# Patient Record
Sex: Male | Born: 1955 | Race: Black or African American | Hispanic: No | Marital: Single | Smoking: Former smoker
Health system: Southern US, Community
[De-identification: ages and names within clinical notes are randomized; demographics above are authoritative.]

## PROBLEM LIST (undated history)

## (undated) DIAGNOSIS — I10 Essential (primary) hypertension: Secondary | ICD-10-CM

## (undated) DIAGNOSIS — I2510 Atherosclerotic heart disease of native coronary artery without angina pectoris: Secondary | ICD-10-CM

## (undated) DIAGNOSIS — M109 Gout, unspecified: Secondary | ICD-10-CM

## (undated) DIAGNOSIS — G629 Polyneuropathy, unspecified: Secondary | ICD-10-CM

## (undated) DIAGNOSIS — J45909 Unspecified asthma, uncomplicated: Secondary | ICD-10-CM

## (undated) DIAGNOSIS — I509 Heart failure, unspecified: Secondary | ICD-10-CM

## (undated) DIAGNOSIS — E119 Type 2 diabetes mellitus without complications: Secondary | ICD-10-CM

## (undated) HISTORY — PX: TONSILLECTOMY: SUR1361

## (undated) HISTORY — PX: PERCUTANEOUS CORONARY STENT INTERVENTION (PCI-S): SHX6016

## (undated) HISTORY — PX: APPENDECTOMY: SHX54

## (undated) HISTORY — PX: CARDIAC SURGERY: SHX584

---

## 2014-08-19 ENCOUNTER — Encounter (HOSPITAL_COMMUNITY): Payer: Self-pay | Admitting: Emergency Medicine

## 2014-08-19 ENCOUNTER — Observation Stay (HOSPITAL_COMMUNITY)
Admission: EM | Admit: 2014-08-19 | Discharge: 2014-08-19 | Disposition: A | Discharge status: Home / Self Care | Payer: Self-pay | Attending: Emergency Medicine | Admitting: Emergency Medicine

## 2014-08-19 ENCOUNTER — Emergency Department (HOSPITAL_COMMUNITY): Payer: Self-pay

## 2014-08-19 DIAGNOSIS — Z87891 Personal history of nicotine dependence: Secondary | ICD-10-CM | POA: Insufficient documentation

## 2014-08-19 DIAGNOSIS — Z880 Allergy status to penicillin: Secondary | ICD-10-CM | POA: Insufficient documentation

## 2014-08-19 DIAGNOSIS — J181 Lobar pneumonia, unspecified organism: Secondary | ICD-10-CM

## 2014-08-19 DIAGNOSIS — J189 Pneumonia, unspecified organism: Secondary | ICD-10-CM | POA: Insufficient documentation

## 2014-08-19 DIAGNOSIS — I2510 Atherosclerotic heart disease of native coronary artery without angina pectoris: Secondary | ICD-10-CM | POA: Insufficient documentation

## 2014-08-19 DIAGNOSIS — I10 Essential (primary) hypertension: Secondary | ICD-10-CM | POA: Insufficient documentation

## 2014-08-19 DIAGNOSIS — I509 Heart failure, unspecified: Secondary | ICD-10-CM | POA: Insufficient documentation

## 2014-08-19 DIAGNOSIS — R0789 Other chest pain: Secondary | ICD-10-CM

## 2014-08-19 DIAGNOSIS — E119 Type 2 diabetes mellitus without complications: Secondary | ICD-10-CM | POA: Insufficient documentation

## 2014-08-19 DIAGNOSIS — R079 Chest pain, unspecified: Principal | ICD-10-CM | POA: Insufficient documentation

## 2014-08-19 HISTORY — DX: Type 2 diabetes mellitus without complications: E11.9

## 2014-08-19 HISTORY — DX: Essential (primary) hypertension: I10

## 2014-08-19 HISTORY — DX: Atherosclerotic heart disease of native coronary artery without angina pectoris: I25.10

## 2014-08-19 HISTORY — DX: Unspecified asthma, uncomplicated: J45.909

## 2014-08-19 HISTORY — DX: Heart failure, unspecified: I50.9

## 2014-08-19 LAB — CBC
HCT: 36.8 % — ABNORMAL LOW (ref 39.0–52.0)
Hemoglobin: 11.7 g/dL — ABNORMAL LOW (ref 13.0–17.0)
MCH: 29.8 pg (ref 26.0–34.0)
MCHC: 31.8 g/dL (ref 30.0–36.0)
MCV: 93.9 fL (ref 78.0–100.0)
PLATELETS: 195 10*3/uL (ref 150–400)
RBC: 3.92 MIL/uL — ABNORMAL LOW (ref 4.22–5.81)
RDW: 15.2 % (ref 11.5–15.5)
WBC: 5.6 10*3/uL (ref 4.0–10.5)

## 2014-08-19 LAB — BASIC METABOLIC PANEL
Anion gap: 8 (ref 5–15)
BUN: 13 mg/dL (ref 6–20)
CALCIUM: 9 mg/dL (ref 8.9–10.3)
CHLORIDE: 104 mmol/L (ref 101–111)
CO2: 28 mmol/L (ref 22–32)
Creatinine, Ser: 0.91 mg/dL (ref 0.61–1.24)
GFR calc Af Amer: >60 mL/min (ref >60)
Glucose, Bld: 93 mg/dL (ref 65–99)
Potassium: 4.1 mmol/L (ref 3.5–5.1)
SODIUM: 140 mmol/L (ref 135–145)

## 2014-08-19 LAB — I-STAT TROPONIN, ED: Troponin i, poc: 0 ng/mL (ref 0.00–0.08)

## 2014-08-19 MED ORDER — CLONIDINE HCL 0.2 MG PO TABS
0.3000 mg | ORAL_TABLET | Freq: Three times a day (TID) | ORAL | Status: DC
  Administered 2014-08-19: 0.3 mg via ORAL
  Filled 2014-08-19 (×2): qty 1

## 2014-08-19 MED ORDER — CLOPIDOGREL BISULFATE 75 MG PO TABS: 75.0000 mg | ORAL_TABLET | Freq: Every day | ORAL | Status: DC

## 2014-08-19 MED ORDER — ASPIRIN 325 MG PO TABS: 325.0000 mg | ORAL_TABLET | Freq: Every day | ORAL | Status: DC

## 2014-08-19 MED ORDER — AZITHROMYCIN 250 MG PO TABS: ORAL_TABLET | ORAL | Status: DC

## 2014-08-19 MED ORDER — DEXTROSE 5 % IV SOLN
2.0000 g | Freq: Once | INTRAVENOUS | Status: AC
  Administered 2014-08-19: 2 g via INTRAVENOUS
  Filled 2014-08-19: qty 2

## 2014-08-19 MED ORDER — MORPHINE SULFATE 4 MG/ML IJ SOLN
4.0000 mg | Freq: Once | INTRAMUSCULAR | Status: AC
  Administered 2014-08-19: 4 mg via INTRAVENOUS
  Filled 2014-08-19: qty 1

## 2014-08-19 MED ORDER — AZITHROMYCIN 250 MG PO TABS
500.0000 mg | ORAL_TABLET | Freq: Once | ORAL | Status: AC
  Administered 2014-08-19: 500 mg via ORAL
  Filled 2014-08-19: qty 2

## 2014-08-19 NOTE — ED Notes (Signed)
Dr Julian Reil, Hospitalist at bedside.

## 2014-08-19 NOTE — ED Notes (Signed)
Attempted report. States they did not know she was getting a patient.

## 2014-08-19 NOTE — Discharge Instructions (Signed)

## 2014-08-19 NOTE — ED Notes (Signed)
Patient in custody with Officer. Officer states patient remain in brown pants and handcuffed to the bed. Officer at bedside.

## 2014-08-19 NOTE — ED Notes (Signed)
Patient come [place] he started having Chest Pain  In the past hour. 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 Patient states the pain feel like pervious MI. Patient describes the pain of 7/10 and sharpness, and radiating. Patient states he has had Sweating N/V. Patient given  and total 4 Nitro and 6 MG morphine with EMS. Patient has 20 L Hand placed by EMS>

## 2014-08-19 NOTE — Consult Note (Signed)
[facility] Medical Consultation  Aran Menning MWU:[id number] DOB: April 29, 1955 DOA: 08/19/2014 PCP: No primary care provider on file.   Requesting physician: Dr. Lynelle Doctor Date of consultation: 08/19/14 Reason for consultation: Chest pain  Impression/Recommendations Principal Problem:   Chest pain    1. Chest pain - Extremely unlikely to be ACS / CAD, the patient just had a negative heart cath 9 days ago at [facility], these records including the procedure report, conclusion, and recommendation of medical management, has been faxed over and is reviewed in writing.  When asked about this, patient admits that this information is accurate. 1. Dont think patient needs admission for ACS evaluation / rule out at this time, this would be ultra-low yield in this patient given the history and ED findings.    Chief Complaint: Chest pain  HPI:  Mr. Darren Morales is a 59 yo M who presents to the ED with left sided chest pain and pressure, radiating to his left arm.  He is currently incarcerated.  He initially reports a history of 7 stents for CAD in the past, with last one a month ago.  CP onset today while being transported from another medical checkup.  He says symptoms were like prior ACS.  While he initially didn't report this to myself or the EDP, it turns out that Mr. Ebersole actually had a stress test a couple of weeks ago and a heart cath on 08/10/14 which demonstrated "no angiographically significant coronary artery disease".  When I specifically asked him about this (after the charge nurse at his prision spoke to me over the phone, informed me that he has frequent history of chest pain admits and faxed to me this documentation of the heart cath 9 days ago done at [facility] hospital); the patient admits that he had a heart cath in the past couple of weeks that "showed no blockages"  Review of Systems:  12 systems reviewed and otherwise negative  Past Medical History  Diagnosis Date  .  Hypertension   . Diabetes mellitus without complication   . Asthma   . Coronary artery disease   . CHF (congestive heart failure)    Past Surgical History  Procedure Laterality Date  . Cardiac surgery    . Percutaneous coronary stent intervention (pci-s)     Social History:  reports that he has quit smoking. He does not have any smokeless tobacco history on file. He reports that he does not drink alcohol or use illicit drugs.  Allergies  Allergen Reactions  . Ciprofloxacin   . Keflex [Cephalexin]   . Motrin [Ibuprofen]   . Nifedipine   . Penicillins   . Toradol [Ketorolac Tromethamine]   . Zofran [Ondansetron Hcl]    No family history on file.  Prior to Admission medications   Medication Sig Start Date End Date Taking? Authorizing Provider  azithromycin (ZITHROMAX) 250 MG tablet Take 1 tab po qd for 5 days 08/19/14   Linwood Dibbles, MD   Physical Exam: Blood pressure 120/98, pulse 86, temperature 97.5 F (36.4 C), temperature source Oral, resp. rate 9, height 6\' 3"  (1.905 m), weight 115.667 kg (255 lb), SpO2 97 %. Filed Vitals:   08/19/14 2122  BP: 120/98  Pulse:   Temp:   Resp:     General:  NAD, resting comfortably in bed Eyes: PEERLA EOMI ENT: mucous membranes moist Neck: supple w/o JVD Cardiovascular: RRR w/o MRG Respiratory: CTA B Abdomen: soft, nt, nd, bs+ Skin: no rash nor lesion Musculoskeletal: MAE, full ROM all  4 extremities Psychiatric: normal tone and affect Neurologic: AAOx3, grossly non-focal  Labs on Admission:  Basic Metabolic Panel:  Recent Labs Lab 08/19/14 1825  NA 140  K 4.1  CL 104  CO2 28  GLUCOSE 93  BUN 13  CREATININE 0.91  CALCIUM 9.0   Liver Function Tests: No results for input(s): AST, ALT, ALKPHOS, BILITOT, PROT, ALBUMIN in the last 168 hours. No results for input(s): LIPASE, AMYLASE in the last 168 hours. No results for input(s): AMMONIA in the last 168 hours. CBC:  Recent Labs Lab 08/19/14 1825  WBC 5.6  HGB 11.7*   HCT 36.8*  MCV 93.9  PLT 195   Cardiac Enzymes: No results for input(s): CKTOTAL, CKMB, CKMBINDEX, TROPONINI in the last 168 hours. BNP: Invalid input(s): POCBNP CBG: No results for input(s): GLUCAP in the last 168 hours.  Radiological Exams on Admission: Dg Chest 2 View  08/19/2014   CLINICAL DATA:  Chest pain for 1 day  EXAM: CHEST  2 VIEW  COMPARISON:  None.  FINDINGS: There is infiltrate in the right middle lobe. Lungs elsewhere clear. Heart size is within normal limits. Pulmonary vascularity is normal. No adenopathy. No bone lesions.  IMPRESSION: Consolidation right middle lobe. Followup PA and lateral chest radiographs recommended in 3-4 weeks following trial of antibiotic therapy to ensure resolution and exclude underlying malignancy.   Electronically Signed   By: Bretta Bang III M.D.   On: 08/19/2014 19:08    EKG: Independently reviewed.  Time spent: 80 min  GARDNER, JARED M. [facility] Pager [phone]  If 7PM-7AM, please contact night-coverage [email or web address] Password [email or web address] 08/19/2014, 9:50 PM

## 2014-08-19 NOTE — ED Provider Notes (Addendum)
CSN: [id number]     Arrival date & time 08/19/14  1812 History   First MD Initiated Contact with Patient 08/19/14 1901     Chief Complaint  Patient presents with  . Chest Pain    Patient is a 59 y.o. male presenting with chest pain. The history is provided by the patient.  Chest Pain Pain location:  Substernal area Pain quality: pressure   Pain radiates to:  L arm Pain severity:  Severe Onset quality:  Sudden Duration:  1 hour Timing:  Constant Progression:  Resolved Chronicity:  Recurrent Relieved by:  Nitroglycerin and aspirin Risk factors: coronary artery disease   Pt is currently incarcerated.  He was getting transported from another medical check up when he started having chest pain.  911 was called.  He was given asa, morphine , ntg and sx improved.  Sx were likely prior ACS.  He has a history of multiple stents in the past.  Denies any fever.    Past Medical History  Diagnosis Date  . Hypertension   . Diabetes mellitus without complication   . Asthma   . Coronary artery disease   . CHF (congestive heart failure)    Past Surgical History  Procedure Laterality Date  . Cardiac surgery     No family history on file. History  Substance Use Topics  . Smoking status: Former Games developer  . Smokeless tobacco: Not on file  . Alcohol Use: No    Review of Systems  Cardiovascular: Positive for chest pain.  All other systems reviewed and are negative.     Allergies  Ciprofloxacin; Keflex; Motrin; Nifedipine; Penicillins; Toradol; and Zofran  Home Medications   Prior to Admission medications   Not on File   BP 148/117 mmHg  Pulse 65  Temp(Src) 97.5 F (36.4 C) (Oral)  Resp 15  Ht 6\' 3"  (1.905 m)  Wt 255 lb (115.667 kg)  BMI 31.87 kg/m2  SpO2 95% Physical Exam  Constitutional: He appears well-developed and well-nourished. No distress.  HENT:  Head: Normocephalic and atraumatic.  Right Ear: External ear normal.  Left Ear: External ear normal.  Eyes:  Conjunctivae are normal. Right eye exhibits no discharge. Left eye exhibits no discharge. No scleral icterus.  Neck: Neck supple. No tracheal deviation present.  Cardiovascular: Normal rate, regular rhythm and intact distal pulses.   Pulmonary/Chest: Effort normal and breath sounds normal. No stridor. No respiratory distress. He has no wheezes. He has no rales.  Abdominal: Soft. Bowel sounds are normal. He exhibits no distension. There is no tenderness. There is no rebound and no guarding.  Musculoskeletal: He exhibits no edema or tenderness.  Neurological: He is alert. He has normal strength. No cranial nerve deficit (no facial droop, extraocular movements intact, no slurred speech) or sensory deficit. He exhibits normal muscle tone. He displays no seizure activity. Coordination normal.  Skin: Skin is warm and dry. No rash noted.  Psychiatric: He has a normal mood and affect.  Nursing note and vitals reviewed.   ED Course  Procedures (including critical care time) Labs Review Labs Reviewed  CBC - Abnormal; Notable for the following:    RBC 3.92 (*)    Hemoglobin 11.7 (*)    HCT 36.8 (*)    All other components within normal limits  BASIC METABOLIC PANEL  I-STAT TROPOININ, ED    Imaging Review Dg Chest 2 View  08/19/2014   CLINICAL DATA:  Chest pain for 1 day  EXAM: CHEST  2 VIEW  COMPARISON:  None.  FINDINGS: There is infiltrate in the right middle lobe. Lungs elsewhere clear. Heart size is within normal limits. Pulmonary vascularity is normal. No adenopathy. No bone lesions.  IMPRESSION: Consolidation right middle lobe. Followup PA and lateral chest radiographs recommended in 3-4 weeks following trial of antibiotic therapy to ensure resolution and exclude underlying malignancy.   Electronically Signed   By: Bretta Bang III M.D.   On: 08/19/2014 19:08     EKG Interpretation   Date/Time:  Wednesday August 19 2014 18:18:43 EDT Ventricular Rate:  74 PR Interval:  193 QRS  Duration: 95 QT Interval:  419 QTC Calculation: 465 R Axis:   16 Text Interpretation:  Sinus rhythm Anterior infarct, old No old tracing to  compare Confirmed by Tasman Zapata  MD-J, Regginald Pask ([id number]) on 08/19/2014 7:35:58 PM      MDM   Final diagnoses:  Chest pain, unspecified chest pain type  CAP (community acquired pneumonia)    Patient's chest x-ray is showing a possible pneumonia. The patient is also having symptoms concerning for recurrent acute coronary syndrome. He is pain-free now. I will consult the medical service regarding admission for anti-biotics serial cardiac evaluation.    Linwood Dibbles, MD 08/19/14 1939  Dr Julian Reil evaluated the patient and [facility].  Pt actually has no history of heart disease.  He had a heart cath two weeks and it was normal.  The patient does not have stents. Pt is stable for discharge.  Outpatient follow up  Linwood Dibbles, MD 08/19/14 2122

## 2015-01-12 ENCOUNTER — Inpatient Hospital Stay
Admission: EM | Admit: 2015-01-12 | Discharge: 2015-01-13 | DRG: 303 | Disposition: A | Discharge status: Home / Self Care | Attending: Internal Medicine | Admitting: Internal Medicine

## 2015-01-12 ENCOUNTER — Emergency Department

## 2015-01-12 ENCOUNTER — Encounter: Payer: Self-pay | Admitting: Emergency Medicine

## 2015-01-12 DIAGNOSIS — Z653 Problems related to other legal circumstances: Secondary | ICD-10-CM | POA: Diagnosis not present

## 2015-01-12 DIAGNOSIS — R079 Chest pain, unspecified: Secondary | ICD-10-CM

## 2015-01-12 DIAGNOSIS — I200 Unstable angina: Secondary | ICD-10-CM

## 2015-01-12 DIAGNOSIS — Z7982 Long term (current) use of aspirin: Secondary | ICD-10-CM | POA: Diagnosis not present

## 2015-01-12 DIAGNOSIS — E669 Obesity, unspecified: Secondary | ICD-10-CM | POA: Diagnosis present

## 2015-01-12 DIAGNOSIS — Z9889 Other specified postprocedural states: Secondary | ICD-10-CM | POA: Diagnosis not present

## 2015-01-12 DIAGNOSIS — I25110 Atherosclerotic heart disease of native coronary artery with unstable angina pectoris: Secondary | ICD-10-CM | POA: Diagnosis not present

## 2015-01-12 DIAGNOSIS — Z87891 Personal history of nicotine dependence: Secondary | ICD-10-CM | POA: Diagnosis not present

## 2015-01-12 DIAGNOSIS — I10 Essential (primary) hypertension: Secondary | ICD-10-CM | POA: Diagnosis present

## 2015-01-12 DIAGNOSIS — Z955 Presence of coronary angioplasty implant and graft: Secondary | ICD-10-CM

## 2015-01-12 DIAGNOSIS — J45909 Unspecified asthma, uncomplicated: Secondary | ICD-10-CM | POA: Diagnosis present

## 2015-01-12 DIAGNOSIS — E1140 Type 2 diabetes mellitus with diabetic neuropathy, unspecified: Secondary | ICD-10-CM | POA: Diagnosis present

## 2015-01-12 DIAGNOSIS — Z888 Allergy status to other drugs, medicaments and biological substances status: Secondary | ICD-10-CM | POA: Diagnosis not present

## 2015-01-12 DIAGNOSIS — Z880 Allergy status to penicillin: Secondary | ICD-10-CM | POA: Diagnosis not present

## 2015-01-12 DIAGNOSIS — I255 Ischemic cardiomyopathy: Secondary | ICD-10-CM | POA: Diagnosis present

## 2015-01-12 DIAGNOSIS — Z79899 Other long term (current) drug therapy: Secondary | ICD-10-CM

## 2015-01-12 DIAGNOSIS — I509 Heart failure, unspecified: Secondary | ICD-10-CM | POA: Diagnosis present

## 2015-01-12 DIAGNOSIS — M109 Gout, unspecified: Secondary | ICD-10-CM | POA: Diagnosis present

## 2015-01-12 DIAGNOSIS — Z8249 Family history of ischemic heart disease and other diseases of the circulatory system: Secondary | ICD-10-CM | POA: Diagnosis not present

## 2015-01-12 DIAGNOSIS — Z9049 Acquired absence of other specified parts of digestive tract: Secondary | ICD-10-CM | POA: Diagnosis not present

## 2015-01-12 DIAGNOSIS — I160 Hypertensive urgency: Secondary | ICD-10-CM

## 2015-01-12 HISTORY — DX: Gout, unspecified: M10.9

## 2015-01-12 HISTORY — DX: Polyneuropathy, unspecified: G62.9

## 2015-01-12 LAB — BASIC METABOLIC PANEL
Anion gap: 7 (ref 5–15)
BUN: 14 mg/dL (ref 6–20)
CALCIUM: 9 mg/dL (ref 8.9–10.3)
CO2: 30 mmol/L (ref 22–32)
CREATININE: 0.85 mg/dL (ref 0.61–1.24)
Chloride: 104 mmol/L (ref 101–111)
GFR calc Af Amer: >60 mL/min (ref >60)
GLUCOSE: 115 mg/dL — AB (ref 65–99)
POTASSIUM: 3.8 mmol/L (ref 3.5–5.1)
Sodium: 141 mmol/L (ref 135–145)

## 2015-01-12 LAB — TROPONIN I
Troponin I: <0.03 ng/mL (ref <0.031)
Troponin I: <0.03 ng/mL (ref <0.031)

## 2015-01-12 LAB — CBC
HEMATOCRIT: 38.8 % — AB (ref 40.0–52.0)
Hemoglobin: 12.2 g/dL — ABNORMAL LOW (ref 13.0–18.0)
MCH: 28.1 pg (ref 26.0–34.0)
MCHC: 31.4 g/dL — AB (ref 32.0–36.0)
MCV: 89.5 fL (ref 80.0–100.0)
Platelets: 252 10*3/uL (ref 150–440)
RBC: 4.33 MIL/uL — ABNORMAL LOW (ref 4.40–5.90)
RDW: 15.9 % — ABNORMAL HIGH (ref 11.5–14.5)
WBC: 5.6 10*3/uL (ref 3.8–10.6)

## 2015-01-12 LAB — GLUCOSE, CAPILLARY
GLUCOSE-CAPILLARY: 114 mg/dL — AB (ref 65–99)
Glucose-Capillary: 134 mg/dL — ABNORMAL HIGH (ref 65–99)

## 2015-01-12 MED ORDER — NITROGLYCERIN 2 % TD OINT
0.5000 [in_us] | TOPICAL_OINTMENT | Freq: Four times a day (QID) | TRANSDERMAL | Status: DC
  Administered 2015-01-12 – 2015-01-13 (×4): 0.5 [in_us] via TOPICAL
  Filled 2015-01-12 (×4): qty 1

## 2015-01-12 MED ORDER — HYDRALAZINE HCL 50 MG PO TABS: 50.0000 mg | ORAL_TABLET | Freq: Three times a day (TID) | ORAL | Status: DC

## 2015-01-12 MED ORDER — CYPROHEPTADINE HCL 4 MG PO TABS
4.0000 mg | ORAL_TABLET | Freq: Three times a day (TID) | ORAL | Status: DC | PRN
  Filled 2015-01-12: qty 1

## 2015-01-12 MED ORDER — ALUM & MAG HYDROXIDE-SIMETH 200-200-20 MG/5ML PO SUSP: 30.0000 mL | Freq: Four times a day (QID) | ORAL | Status: DC | PRN

## 2015-01-12 MED ORDER — HYDRALAZINE HCL 20 MG/ML IJ SOLN
20.0000 mg | INTRAMUSCULAR | Status: DC | PRN
  Administered 2015-01-12: 20 mg via INTRAVENOUS
  Filled 2015-01-12: qty 1

## 2015-01-12 MED ORDER — ACETAMINOPHEN 650 MG RE SUPP: 650.0000 mg | Freq: Four times a day (QID) | RECTAL | Status: DC | PRN

## 2015-01-12 MED ORDER — NITROGLYCERIN IN D5W 200-5 MCG/ML-% IV SOLN
0.0000 ug/min | Freq: Once | INTRAVENOUS | Status: AC
  Administered 2015-01-12: 10 ug/min via INTRAVENOUS

## 2015-01-12 MED ORDER — OXYCODONE HCL 5 MG PO TABS
5.0000 mg | ORAL_TABLET | ORAL | Status: DC | PRN
Start: 2015-01-12 — End: 2015-01-13
  Administered 2015-01-12 – 2015-01-13 (×5): 5 mg via ORAL
  Filled 2015-01-12 (×5): qty 1

## 2015-01-12 MED ORDER — FLUOXETINE HCL 20 MG PO CAPS
20.0000 mg | ORAL_CAPSULE | Freq: Every day | ORAL | Status: DC
  Administered 2015-01-12: 20 mg via ORAL
  Filled 2015-01-12: qty 1

## 2015-01-12 MED ORDER — LOSARTAN POTASSIUM 50 MG PO TABS
100.0000 mg | ORAL_TABLET | Freq: Every day | ORAL | Status: DC
  Administered 2015-01-13: 100 mg via ORAL
  Filled 2015-01-12: qty 2

## 2015-01-12 MED ORDER — CLOPIDOGREL BISULFATE 75 MG PO TABS
75.0000 mg | ORAL_TABLET | Freq: Every day | ORAL | Status: DC
  Administered 2015-01-13: 75 mg via ORAL
  Filled 2015-01-12: qty 1

## 2015-01-12 MED ORDER — INSULIN ASPART 100 UNIT/ML ~~LOC~~ SOLN
0.0000 [IU] | Freq: Three times a day (TID) | SUBCUTANEOUS | Status: DC
  Filled 2015-01-12: qty 3

## 2015-01-12 MED ORDER — ACETAMINOPHEN 325 MG PO TABS: 650.0000 mg | ORAL_TABLET | Freq: Four times a day (QID) | ORAL | Status: DC | PRN

## 2015-01-12 MED ORDER — SODIUM CHLORIDE 0.9 % IJ SOLN: 3.0000 mL | Freq: Two times a day (BID) | INTRAMUSCULAR | Status: DC

## 2015-01-12 MED ORDER — HEPARIN (PORCINE) IN NACL 100-0.45 UNIT/ML-% IJ SOLN
1400.0000 [IU]/h | INTRAMUSCULAR | Status: DC
  Administered 2015-01-12 – 2015-01-13 (×2): 1400 [IU]/h via INTRAVENOUS
  Filled 2015-01-12 (×4): qty 250

## 2015-01-12 MED ORDER — LABETALOL HCL 5 MG/ML IV SOLN
20.0000 mg | Freq: Once | INTRAVENOUS | Status: AC
  Administered 2015-01-12: 20 mg via INTRAVENOUS
  Filled 2015-01-12: qty 4

## 2015-01-12 MED ORDER — MORPHINE SULFATE (PF) 2 MG/ML IV SOLN
1.0000 mg | INTRAVENOUS | Status: DC | PRN
Start: 2015-01-12 — End: 2015-01-13
  Administered 2015-01-12: 1 mg via INTRAVENOUS
  Filled 2015-01-12 (×2): qty 1

## 2015-01-12 MED ORDER — CARVEDILOL 25 MG PO TABS
25.0000 mg | ORAL_TABLET | Freq: Two times a day (BID) | ORAL | Status: DC
  Administered 2015-01-12: 25 mg via ORAL
  Filled 2015-01-12 (×2): qty 1

## 2015-01-12 MED ORDER — HEPARIN BOLUS VIA INFUSION
4000.0000 [IU] | Freq: Once | INTRAVENOUS | Status: AC
  Administered 2015-01-12: 4000 [IU] via INTRAVENOUS
  Filled 2015-01-12: qty 4000

## 2015-01-12 MED ORDER — HYDROCHLOROTHIAZIDE 25 MG PO TABS
25.0000 mg | ORAL_TABLET | Freq: Every day | ORAL | Status: DC
  Administered 2015-01-13: 25 mg via ORAL
  Filled 2015-01-12: qty 1

## 2015-01-12 MED ORDER — ATORVASTATIN CALCIUM 20 MG PO TABS
40.0000 mg | ORAL_TABLET | Freq: Every day | ORAL | Status: DC
  Administered 2015-01-12: 40 mg via ORAL
  Filled 2015-01-12: qty 2

## 2015-01-12 MED ORDER — HYDRALAZINE HCL 50 MG PO TABS
100.0000 mg | ORAL_TABLET | Freq: Three times a day (TID) | ORAL | Status: DC
  Administered 2015-01-12 – 2015-01-13 (×3): 100 mg via ORAL
  Filled 2015-01-12 (×3): qty 2

## 2015-01-12 MED ORDER — ISOSORBIDE MONONITRATE ER 60 MG PO TB24
60.0000 mg | ORAL_TABLET | Freq: Every day | ORAL | Status: DC
  Administered 2015-01-13: 60 mg via ORAL
  Filled 2015-01-12: qty 1

## 2015-01-12 MED ORDER — INSULIN ASPART 100 UNIT/ML ~~LOC~~ SOLN: 0.0000 [IU] | Freq: Every day | SUBCUTANEOUS | Status: DC

## 2015-01-12 MED ORDER — CLONIDINE HCL 0.1 MG PO TABS
0.2000 mg | ORAL_TABLET | Freq: Three times a day (TID) | ORAL | Status: DC
  Administered 2015-01-12 – 2015-01-13 (×4): 0.2 mg via ORAL
  Filled 2015-01-12 (×4): qty 2

## 2015-01-12 MED ORDER — MORPHINE SULFATE (PF) 4 MG/ML IV SOLN
INTRAVENOUS | Status: AC
  Administered 2015-01-12: 4 mg
  Filled 2015-01-12: qty 1

## 2015-01-12 MED ORDER — RANOLAZINE ER 500 MG PO TB12
500.0000 mg | ORAL_TABLET | Freq: Two times a day (BID) | ORAL | Status: DC
  Administered 2015-01-12 – 2015-01-13 (×2): 500 mg via ORAL
  Filled 2015-01-12 (×2): qty 1

## 2015-01-12 MED ORDER — NITROGLYCERIN IN D5W 200-5 MCG/ML-% IV SOLN
INTRAVENOUS | Status: AC
  Filled 2015-01-12: qty 250

## 2015-01-12 MED ORDER — PANTOPRAZOLE SODIUM 40 MG PO TBEC
40.0000 mg | DELAYED_RELEASE_TABLET | Freq: Every day | ORAL | Status: DC
  Administered 2015-01-13: 40 mg via ORAL
  Filled 2015-01-12: qty 1

## 2015-01-12 MED ORDER — ASPIRIN EC 81 MG PO TBEC
81.0000 mg | DELAYED_RELEASE_TABLET | Freq: Every day | ORAL | Status: DC
  Administered 2015-01-13: 81 mg via ORAL
  Filled 2015-01-12: qty 1

## 2015-01-12 MED ORDER — SENNOSIDES-DOCUSATE SODIUM 8.6-50 MG PO TABS: 1.0000 | ORAL_TABLET | Freq: Every evening | ORAL | Status: DC | PRN

## 2015-01-12 NOTE — Progress Notes (Signed)
Heparin 4000 unit bolus given followed by Heparin 1400 units/hr.  Verified by Kemper Durieonnisha, RN

## 2015-01-12 NOTE — Plan of Care (Signed)
Problem: Safety: Goal: Ability to remain free from injury will improve Outcome: Progressing Fall precautions in place  Problem: Pain Managment: Goal: General experience of comfort will improve Outcome: Progressing Prn meds  Problem: Tissue Perfusion: Goal: Risk factors for ineffective tissue perfusion will decrease Outcome: Progressing Heparin gtt  Problem: Consults Goal: Diabetes Guidelines if Diabetic/Glucose > 140 If diabetic or lab glucose is > 140 mg/dl - Initiate Diabetes/Hyperglycemia Guidelines & Document Interventions  Outcome: Progressing Protocol ordered

## 2015-01-12 NOTE — Progress Notes (Signed)
59 yo bm admitted to room 248 via stretcher from ED with chest pain.  A&O x3, pt states unable to ambulate due to being dizzy when up.  No distress on ra.  Cardiac monitor applied and verified by Edson SnowballShana, CNA. Lungs clear bil. Abdomen benign.  SL lt ac flushes well.  Oriented to room and surroundings.  Pt is a prisoner, lt leg shackled to bed and 2 guards present. POC reviewed with pt.  CB in reach, SR up x2.

## 2015-01-12 NOTE — ED Provider Notes (Signed)
[facility] Emergency Department Provider Note   ____________________________________________  Time seen: 11:10AM I have reviewed the triage vital signs and the triage nursing note.  HISTORY  Chief Complaint Chest Pain   Historian Patient  HPI Darren Morales is a [age] y.o. male with a history of MI in 2013, who is presenting with acute onset central chest pain that radiates into the left arm this started just prior to arrival and feels like the last time he was diagnosed with a heart attack. He does have a history of stents, hypertension, and CHF.  Pain is moderate to severe. Positive nausea. No sweating. Mild shortness of breath. No fevers or recent coughing illness.  No weakness or numbness.    Past Medical History  Diagnosis Date  . Hypertension   . Diabetes mellitus without complication (HCC)   . Asthma   . Coronary artery disease   . CHF (congestive heart failure) (HCC)   . Gout   . Neuropathy [facility](HCC)     Patient Active Problem List   Diagnosis Date Noted  . Chest pain 08/19/2014    Past Surgical History  Procedure Laterality Date  . Cardiac surgery    . Percutaneous coronary stent intervention (pci-s)    . Tonsillectomy    . Appendectomy      Current Outpatient Rx  Name  Route  Sig  Dispense  Refill  . aspirin EC 81 MG tablet   Oral   Take 81 mg by mouth daily.         Marland Kitchen. atorvastatin (LIPITOR) 40 MG tablet   Oral   Take 40 mg by mouth at bedtime.         . carvedilol (COREG) 25 MG tablet   Oral   Take 25 mg by mouth 2 (two) times daily with a meal.         . cloNIDine (CATAPRES) 0.2 MG tablet   Oral   Take 0.2 mg by mouth 3 (three) times daily.         . clopidogrel (PLAVIX) 75 MG tablet   Oral   Take 75 mg by mouth daily.         . cyproheptadine (PERIACTIN) 4 MG tablet   Oral   Take 4 mg by mouth 3 (three) times daily as needed for allergies.         Marland Kitchen. FLUoxetine (PROZAC) 20 MG capsule   Oral   Take  20 mg by mouth at bedtime.         . hydrALAZINE (APRESOLINE) 50 MG tablet   Oral   Take 50 mg by mouth 3 (three) times daily.         . hydrochlorothiazide (HYDRODIURIL) 25 MG tablet   Oral   Take 25 mg by mouth daily.         . isosorbide mononitrate (IMDUR) 60 MG 24 hr tablet   Oral   Take 60 mg by mouth daily.         Marland Kitchen. losartan (COZAAR) 50 MG tablet   Oral   Take 100 mg by mouth daily.         . pantoprazole (PROTONIX) 40 MG tablet   Oral   Take 40 mg by mouth daily.         . ranolazine (RANEXA) 500 MG 12 hr tablet   Oral   Take 500 mg by mouth 2 (two) times daily.           Allergies Ciprofloxacin; Keflex;  Motrin; Nifedipine; Penicillins; Toradol; and Zofran  History reviewed. No pertinent family history.  Social History Social History  Substance Use Topics  . Smoking status: Former Games developer  . Smokeless tobacco: None  . Alcohol Use: No    Review of Systems  Constitutional: Negative for fever. Eyes: Negative for visual changes. ENT: Negative for sore throat. Cardiovascular: Positive for chest pain. Respiratory: Positive for shortness of breath. Gastrointestinal: Negative for abdominal pain, vomiting and diarrhea. Genitourinary: Negative for dysuria. Musculoskeletal: Negative for back pain. Skin: Negative for rash. Neurological: Negative for headache. 10 point Review of Systems otherwise negative ____________________________________________   PHYSICAL EXAM:  VITAL SIGNS: ED Triage Vitals  Enc Vitals Group     BP --      Pulse Rate 01/12/15 1048 65     Resp 01/12/15 1048 18     Temp 01/12/15 1048 97.5 F (36.4 C)     Temp Source 01/12/15 1048 Oral     SpO2 01/12/15 1048 100 %     Weight 01/12/15 1048 260 lb (117.935 kg)     Height 01/12/15 1048  (1.905 m)     Head Cir --      Peak Flow --      Pain Score 01/12/15 1050 8     Pain Loc --      Pain Edu? --      Excl. in GC? --      Constitutional: Alert and oriented.  Well appearing and in no distress. Eyes: Conjunctivae are normal. PERRL. Normal extraocular movements. ENT   Head: Normocephalic and atraumatic.   Nose: No congestion/rhinnorhea.   Mouth/Throat: Mucous membranes are moist.   Neck: No stridor. Cardiovascular/Chest: Normal rate, regular rhythm.  No murmurs, rubs, or gallops. Respiratory: Normal respiratory effort without tachypnea nor retractions. Breath sounds are clear and equal bilaterally. No wheezes/rales/rhonchi. Gastrointestinal: Soft. No distention, no guarding, no rebound. Nontender.  Genitourinary/rectal:Deferred Musculoskeletal: Nontender with normal range of motion in all extremities. No joint effusions.  No lower extremity tenderness.  No edema. Neurologic:  Normal speech and language. No gross or focal neurologic deficits are appreciated. Skin:  Skin is warm, dry and intact. No rash noted. Psychiatric: Mood and affect are normal. Speech and behavior are normal. Patient exhibits appropriate insight and judgment.  ____________________________________________   EKG I, Governor Rooks, MD, the attending physician have personally viewed and interpreted all ECGs.  76 bpm. Normal sinus rhythm. Narrow QRS. Normal axis. Nonspecific T-wave ____________________________________________  LABS (pertinent positives/negatives)  Patient will Without significant abnormalities White blood count 5.6, hemoglobin 12.2 and platelet count 252 Troponin less than 0.03  ____________________________________________  RADIOLOGY All Xrays were viewed by me. Imaging interpreted by Radiologist.  Chest x-ray portable: no active disease __________________________________________  PROCEDURES  Procedure(s) performed: None  Critical Care performed: CRITICAL CARE Performed by: Governor Rooks   Total critical care time: 45 minutes  Critical care time was exclusive of separately billable procedures and treating other  patients.  Critical care was necessary to treat or prevent imminent or life-threatening deterioration.  Critical care was time spent personally by me on the following activities: development of treatment plan with patient and/or surrogate as well as nursing, discussions with consultants, evaluation of patient's response to treatment, examination of patient, obtaining history from patient or surrogate, ordering and performing treatments and interventions, ordering and review of laboratory studies, ordering and review of radiographic studies, pulse oximetry and re-evaluation of patient's condition.   ____________________________________________   ED COURSE / ASSESSMENT AND PLAN  CONSULTATIONS: Dr. Juliene Pina, hospitalist for admission  Pertinent labs & imaging results that were available during my care of the patient were reviewed by me and considered in my medical decision making (see chart for details).  This patient arrived complaining of central chest pain up into his left arm, concerning for possible cardiac etiology in a patient with a history of stents and prior MI that felt similar. Patient did have some improvement with sublingual nitros, 3, prehospital after aspirin. His blood pressure severely elevated on arrival, and so I started nitroglycerin drip.  No real significant improvement despite nitroglycerin drip titrated up. Patient was given morphine, did have some improvement in pain, blood pressure is still significantly elevated.  Out of concern for hypertensive urgency/emergency, patient was given a dose of labetalol 20 mg IV.  Blood pressure did respond to this and come down to a systolic blood pressure 120. He was stopped off of the nitroglycerin drip at this point time. Repeat blood pressure did show a systolic blood pressure 108. Patient is currently still complaining of some chest pressure, but much improved.  Discharge blood pressure was somewhat unexpected, and not the goal.  We'll continue to monitor.  Patient be admitted for chest pain rule out.   Patient / Family / Caregiver informed of clinical course, medical decision-making process, and agree with plan.    ___________________________________________   FINAL CLINICAL IMPRESSION(S) / ED DIAGNOSES   Final diagnoses:  Chest pain, unspecified chest pain type  Hypertensive urgency       Governor Rooks, MD 01/12/15 1424

## 2015-01-12 NOTE — ED Notes (Signed)
Pt reports waiting for the bus when he developed chest pain that he describes as stabbing and squeezing, with radiation to L arm.  Pt also reports having nausea, dizziness, diaphoresis.  Pt has history of coronary artery disease and stents.

## 2015-01-12 NOTE — Progress Notes (Addendum)
ANTICOAGULATION CONSULT NOTE - Initial Consult  Pharmacy Consult for Heparin  Indication: chest pain/ACS  Allergies  Allergen Reactions  . Ciprofloxacin   . Fish Allergy   . Keflex [Cephalexin]   . Motrin [Ibuprofen]   . Nifedipine   . Penicillins   . Pork-Derived Products   . Shrimp [Shellfish Allergy]   . Toradol [Ketorolac Tromethamine]   . Zofran [Ondansetron Hcl]     Patient Measurements: Height: 6\' 3"  (190.5 cm) Weight: 260 lb (117.935 kg) IBW/kg (Calculated) : 84.5 Heparin Dosing Weight: 109.3 kg   Vital Signs: Temp: 97.6 F (36.4 C) (12/20 1710) Temp Source: Oral (12/20 1710) BP: 155/114 mmHg (12/20 1710) Pulse Rate: 58 (12/20 1710)  Labs:  Recent Labs  01/12/15 1104  HGB 12.2*  HCT 38.8*  PLT 252  CREATININE 0.85  TROPONINI <0.03    Estimated Creatinine Clearance: 132.8 mL/min (by C-G formula based on Cr of 0.85).   Medical History: Past Medical History  Diagnosis Date  . Hypertension   . Diabetes mellitus without complication (HCC)   . Asthma   . Coronary artery disease   . CHF (congestive heart failure) (HCC)   . Gout   . Neuropathy (HCC)     Medications:  Prescriptions prior to admission  Medication Sig Dispense Refill Last Dose  . aspirin EC 81 MG tablet Take 81 mg by mouth daily.   01/12/2015 at Unknown time  . atorvastatin (LIPITOR) 40 MG tablet Take 40 mg by mouth at bedtime.   01/11/2015 at Unknown time  . carvedilol (COREG) 25 MG tablet Take 25 mg by mouth 2 (two) times daily with a meal.   01/12/2015 at Unknown time  . cloNIDine (CATAPRES) 0.2 MG tablet Take 0.2 mg by mouth 3 (three) times daily.   01/12/2015 at Unknown time  . clopidogrel (PLAVIX) 75 MG tablet Take 75 mg by mouth daily.   01/12/2015 at Unknown time  . cyproheptadine (PERIACTIN) 4 MG tablet Take 4 mg by mouth 3 (three) times daily as needed for allergies.   PRN  . FLUoxetine (PROZAC) 20 MG capsule Take 20 mg by mouth at bedtime.   01/11/2015 at Unknown time  .  hydrALAZINE (APRESOLINE) 50 MG tablet Take 50 mg by mouth 3 (three) times daily.   01/12/2015 at Unknown time  . hydrochlorothiazide (HYDRODIURIL) 25 MG tablet Take 25 mg by mouth daily.   01/12/2015 at Unknown time  . isosorbide mononitrate (IMDUR) 60 MG 24 hr tablet Take 60 mg by mouth daily.   01/12/2015 at Unknown time  . losartan (COZAAR) 50 MG tablet Take 100 mg by mouth daily.   01/12/2015 at Unknown time  . pantoprazole (PROTONIX) 40 MG tablet Take 40 mg by mouth daily.   01/12/2015 at Unknown time  . ranolazine (RANEXA) 500 MG 12 hr tablet Take 500 mg by mouth 2 (two) times daily.   01/12/2015 at Unknown time    Assessment: Pharmacy consulted to dose heparin in this 59 year old male admitted with ACS.  No prior anticoag noted.  Goal of Therapy:  Heparin level 0.3-0.7 units/ml Monitor platelets by anticoagulation protocol: Yes   Plan:  Give 4000 units bolus x 1 Start heparin infusion at 1400 units/hr  Will order 1st HL 6 hrs after start of drip on 12/21 @ 00:00. Will check CBC daily.   Shary Lamos D 01/12/2015,5:30 PM

## 2015-01-12 NOTE — H&P (Signed)
[facility] [place] [facility]   PATIENT NAME: Darren Morales    MR#:  [id number]  DATE OF BIRTH:  March 30, 1955  DATE OF ADMISSION:  01/12/2015  PRIMARY CARE PHYSICIAN: No PCP Per Patient   REQUESTING/REFERRING PHYSICIAN: Dr. Shaune Pollack  CHIEF COMPLAINT:   Chest pain HISTORY OF PRESENT ILLNESS:  Darren Morales  is a 59 y.o. male with a known history of coronary artery disease, essential hypertension and diabetes who presents from prison due to above complaint. Patient reports and sign 30s mornings at substernal chest pain rating to his left side. He denies shortness of breath or nausea associated. He says it's chest pain lasted a few hours. This is relieved with nitroglycerin that was given in the ER. It was also noted in the emergency room that his blood pressure was very elevated. He is chest pain-free currently. Patient denies any exacerbating factors. He states he had stents placed in June 2016.  PAST MEDICAL HISTORY:   Past Medical History  Diagnosis Date  . Hypertension   . Diabetes mellitus without complication (HCC)   . Asthma   . Coronary artery disease   . CHF (congestive heart failure) (HCC)   . Gout   . Neuropathy (HCC)     PAST SURGICAL HISTORY:   Past Surgical History  Procedure Laterality Date  . Cardiac surgery    . Percutaneous coronary stent intervention (pci-s)    . Tonsillectomy    . Appendectomy      SOCIAL HISTORY:   Social History  Substance Use Topics  . Smoking status: Former Games developer  . Smokeless tobacco: N  . Alcohol Use: No    FAMILY HISTORY:  Positive hypertension  DRUG ALLERGIES:   Allergies  Allergen Reactions  . Ciprofloxacin   . Keflex [Cephalexin]   . Motrin [Ibuprofen]   . Nifedipine   . Penicillins   . Toradol [Ketorolac Tromethamine]   . Zofran [Ondansetron Hcl]      REVIEW OF SYSTEMS:  CONSTITUTIONAL: No fever, fatigue or weakness.  EYES: No blurred or double vision.  EARS, NOSE, AND  THROAT: No tinnitus or ear pain.  RESPIRATORY: No cough, shortness of breath, wheezing or hemoptysis.  CARDIOVASCULAR: No Current chest pain, orthopnea, edema.  GASTROINTESTINAL: No nausea, vomiting, diarrhea or abdominal pain.  GENITOURINARY: No dysuria, hematuria.  ENDOCRINE: No polyuria, nocturia,  HEMATOLOGY: No anemia, easy bruising or bleeding SKIN: No rash or lesion. MUSCULOSKELETAL: No joint pain or arthritis.   NEUROLOGIC: No tingling, numbness, weakness.  PSYCHIATRY: No anxiety or depression.   MEDICATIONS AT HOME:   Prior to Admission medications   Medication Sig Start Date End Date Taking? Authorizing Provider  aspirin EC 81 MG tablet Take 81 mg by mouth daily.   Yes Historical Provider, MD  atorvastatin (LIPITOR) 40 MG tablet Take 40 mg by mouth at bedtime.   Yes Historical Provider, MD  carvedilol (COREG) 25 MG tablet Take 25 mg by mouth 2 (two) times daily with a meal.   Yes Historical Provider, MD  cloNIDine (CATAPRES) 0.2 MG tablet Take 0.2 mg by mouth 3 (three) times daily.   Yes Historical Provider, MD  clopidogrel (PLAVIX) 75 MG tablet Take 75 mg by mouth daily.   Yes Historical Provider, MD  cyproheptadine (PERIACTIN) 4 MG tablet Take 4 mg by mouth 3 (three) times daily as needed for allergies.   Yes Historical Provider, MD  FLUoxetine (PROZAC) 20 MG capsule Take 20 mg by mouth at bedtime.   Yes Historical  Provider, MD  hydrALAZINE (APRESOLINE) 50 MG tablet Take 50 mg by mouth 3 (three) times daily.   Yes Historical Provider, MD  hydrochlorothiazide (HYDRODIURIL) 25 MG tablet Take 25 mg by mouth daily.   Yes Historical Provider, MD  isosorbide mononitrate (IMDUR) 60 MG 24 hr tablet Take 60 mg by mouth daily.   Yes Historical Provider, MD  losartan (COZAAR) 50 MG tablet Take 100 mg by mouth daily.   Yes Historical Provider, MD  pantoprazole (PROTONIX) 40 MG tablet Take 40 mg by mouth daily.   Yes Historical Provider, MD  ranolazine (RANEXA) 500 MG 12 hr tablet Take 500  mg by mouth 2 (two) times daily.   Yes Historical Provider, MD      VITAL SIGNS:  Blood pressure 149/128, pulse 66, temperature 97.5 F (36.4 C), temperature source Oral, resp. rate 20, height 6\' 3"  (1.905 m), weight 117.935 kg (260 lb), SpO2 99 %.  PHYSICAL EXAMINATION:  GENERAL:  59 y.o.-year-old patient lying in the bed with no acute distress.  EYES: Pupils equal, round, reactive to light and accommodation. No scleral icterus. Extraocular muscles intact.  HEENT: Head atraumatic, normocephalic. Oropharynx and nasopharynx clear.  NECK:  Supple, no jugular venous distention. No thyroid enlargement, no tenderness.  LUNGS: Normal breath sounds bilaterally, no wheezing, rales,rhonchi or crepitation. No use of accessory muscles of respiration.  CARDIOVASCULAR: S1, S2 normal. No murmurs, rubs, or gallops.  ABDOMEN: Soft, nontender, nondistended. Bowel sounds present. No organomegaly or mass.  EXTREMITIES: No pedal edema, cyanosis, or clubbing.  he is wearing shackles  NEUROLOGIC: Cranial nerves II through XII are grossly intact. No focal deficits. PSYCHIATRIC: The patient is alert and oriented x 3.  SKIN: No obvious rash, lesion, or ulcer.   LABORATORY PANEL:   CBC  Recent Labs Lab 01/12/15 1104  WBC 5.6  HGB 12.2*  HCT 38.8*  PLT 252   ------------------------------------------------------------------------------------------------------------------  Chemistries   Recent Labs Lab 01/12/15 1104  NA 141  K 3.8  CL 104  CO2 30  GLUCOSE 115*  BUN 14  CREATININE 0.85  CALCIUM 9.0   ------------------------------------------------------------------------------------------------------------------  Cardiac Enzymes  Recent Labs Lab 01/12/15 1104  TROPONINI <0.03   ------------------------------------------------------------------------------------------------------------------  RADIOLOGY:  Dg Chest Port 1 View  01/12/2015  CLINICAL DATA:  Chest pain EXAM: PORTABLE  CHEST 1 VIEW COMPARISON:  08/19/2014 FINDINGS: Cardiac enlargement. Negative for heart failure. Lungs are clear without infiltrate or effusion. Negative for pneumonia. IMPRESSION: No active disease. Electronically Signed   By: Marlan Palauharles  Clark M.D.   On: 01/12/2015 11:45    EKG:   No ST elevation or depression normal sinus rhythm   IMPRESSION AND PLAN:   59 year old male with a history of CAD status post 8 stents he reports, essential hypertension and diabetes who presents from prison with unstable angina.   1. Unstable angina: Patient will be admitted to the hospice service. We'll continue with outpatient medications including aspirin and statin. Troponins 3 will be ordered. If these are negative he will undergo cardiac stress test. Continue Coreg. I will add nitroglycerin for unstable angina.  2. Malignant hypertension: Continue indoor, HCTZ, clonidine, Coreg and losartan. Hydralazine when necessary has been ordered. I will increase hydralazine orally from 50-100 mg by mouth 3 times a day for better blood pressure control. 3. Diabetes type 2: It appears that this is diet controlled. I will check hemoglobin A1c, sliding scale insulin and ADA diet   All the records are reviewed and case discussed with ED provider. Management plans  discussed with the patient and he is in agreement.  CODE STATUS: full  TOTAL TIME TAKING CARE OF THIS PATIENT: 55  minutes.    Anas Reister M.D on 01/12/2015 at 2:58 PM  Between 7am to 6pm - Pager - [phone] After 6pm go to [email or web address] - password [email or web address] [facility]  [place] [facility]  Office  [phone]  CC: Primary care physician; No PCP Per Patient

## 2015-01-13 ENCOUNTER — Inpatient Hospital Stay (HOSPITAL_BASED_OUTPATIENT_CLINIC_OR_DEPARTMENT_OTHER)

## 2015-01-13 DIAGNOSIS — I200 Unstable angina: Secondary | ICD-10-CM | POA: Diagnosis not present

## 2015-01-13 LAB — LIPID PANEL
Cholesterol: 116 mg/dL (ref 0–200)
HDL: 27 mg/dL — ABNORMAL LOW (ref >40)
LDL CALC: 67 mg/dL (ref 0–99)
Total CHOL/HDL Ratio: 4.3 RATIO
Triglycerides: 111 mg/dL (ref <150)
VLDL: 22 mg/dL (ref 0–40)

## 2015-01-13 LAB — NM MYOCAR MULTI W/SPECT W/WALL MOTION / EF
CHL CUP RESTING HR STRESS: 57 {beats}/min
CSEPPHR: 85 {beats}/min
LV dias vol: 158 mL
LVSYSVOL: 53 mL
NUC STRESS TID: 1.06
Percent HR: 52 %
SDS: 2
SRS: 0
SSS: 2

## 2015-01-13 LAB — BASIC METABOLIC PANEL
Anion gap: 4 — ABNORMAL LOW (ref 5–15)
BUN: 12 mg/dL (ref 6–20)
CHLORIDE: 105 mmol/L (ref 101–111)
CO2: 31 mmol/L (ref 22–32)
Calcium: 8.8 mg/dL — ABNORMAL LOW (ref 8.9–10.3)
Creatinine, Ser: 0.94 mg/dL (ref 0.61–1.24)
GFR calc Af Amer: >60 mL/min (ref >60)
GFR calc non Af Amer: >60 mL/min (ref >60)
GLUCOSE: 154 mg/dL — AB (ref 65–99)
POTASSIUM: 3.7 mmol/L (ref 3.5–5.1)
Sodium: 140 mmol/L (ref 135–145)

## 2015-01-13 LAB — CBC
HEMATOCRIT: 36.5 % — AB (ref 40.0–52.0)
Hemoglobin: 11.8 g/dL — ABNORMAL LOW (ref 13.0–18.0)
MCH: 29.2 pg (ref 26.0–34.0)
MCHC: 32.5 g/dL (ref 32.0–36.0)
MCV: 89.8 fL (ref 80.0–100.0)
Platelets: 228 10*3/uL (ref 150–440)
RBC: 4.06 MIL/uL — ABNORMAL LOW (ref 4.40–5.90)
RDW: 16 % — AB (ref 11.5–14.5)
WBC: 6.7 10*3/uL (ref 3.8–10.6)

## 2015-01-13 LAB — HEPARIN LEVEL (UNFRACTIONATED)
HEPARIN UNFRACTIONATED: 0.36 [IU]/mL (ref 0.30–0.70)
Heparin Unfractionated: 0.41 IU/mL (ref 0.30–0.70)

## 2015-01-13 LAB — HEMOGLOBIN A1C: HEMOGLOBIN A1C: 6.4 % — AB (ref 4.0–6.0)

## 2015-01-13 LAB — GLUCOSE, CAPILLARY
Glucose-Capillary: 127 mg/dL — ABNORMAL HIGH (ref 65–99)
Glucose-Capillary: 110 mg/dL — ABNORMAL HIGH (ref 65–99)
Glucose-Capillary: 146 mg/dL — ABNORMAL HIGH (ref 65–99)

## 2015-01-13 LAB — TROPONIN I
Troponin I: <0.03 ng/mL (ref <0.031)
Troponin I: <0.03 ng/mL (ref <0.031)

## 2015-01-13 MED ORDER — REGADENOSON 0.4 MG/5ML IV SOLN
0.4000 mg | Freq: Once | INTRAVENOUS | Status: AC
  Administered 2015-01-13: 0.4 mg via INTRAVENOUS

## 2015-01-13 MED ORDER — GUAIFENESIN ER 600 MG PO TB12: 600.0000 mg | ORAL_TABLET | Freq: Two times a day (BID) | ORAL | Status: DC | PRN

## 2015-01-13 MED ORDER — TECHNETIUM TC 99M SESTAMIBI - CARDIOLITE
30.0000 | Freq: Once | INTRAVENOUS | Status: AC | PRN
  Administered 2015-01-13: 10:00:00 30.62 via INTRAVENOUS

## 2015-01-13 MED ORDER — TECHNETIUM TC 99M SESTAMIBI - CARDIOLITE
13.3870 | Freq: Once | INTRAVENOUS | Status: AC | PRN
  Administered 2015-01-13: 08:00:00 13.387 via INTRAVENOUS

## 2015-01-13 MED ORDER — HYDRALAZINE HCL 50 MG PO TABS: 100.0000 mg | ORAL_TABLET | Freq: Three times a day (TID) | ORAL | Status: AC

## 2015-01-13 MED ORDER — GUAIFENESIN ER 600 MG PO TB12: 600.0000 mg | ORAL_TABLET | Freq: Two times a day (BID) | ORAL | Status: AC | PRN

## 2015-01-13 NOTE — Progress Notes (Signed)
ANTICOAGULATION CONSULT NOTE - Initial Consult  Pharmacy Consult for Heparin  Indication: chest pain/ACS  Allergies  Allergen Reactions  . Ciprofloxacin   . Fish Allergy   . Keflex [Cephalexin]   . Motrin [Ibuprofen]   . Nifedipine   . Penicillins   . Pork-Derived Products   . Shrimp [Shellfish Allergy]   . Toradol [Ketorolac Tromethamine]   . Zofran [Ondansetron Hcl]     Patient Measurements: Height:  (190.5 cm) Weight: 283 lb 9.6 oz (128.64 kg) IBW/kg (Calculated) : 84.5 Heparin Dosing Weight: 109.3 kg   Vital Signs: Temp: 98 F (36.7 C) (12/21 0444) Temp Source: Oral (12/21 0444) BP: 143/108 mmHg (12/21 0444) Pulse Rate: 86 (12/21 0444)  Labs:  Recent Labs  01/12/15 1104 01/12/15 1843 01/12/15 2324 01/13/15 0505  HGB 12.2*  --   --  11.8*  HCT 38.8*  --   --  36.5*  PLT 252  --   --  228  HEPARINUNFRC  --   --  0.41 0.36  CREATININE 0.85  --   --  0.94  TROPONINI <0.03 <0.03 <0.03 <0.03    Estimated Creatinine Clearance: 125.2 mL/min (by C-G formula based on Cr of 0.94).   Medical History: Past Medical History  Diagnosis Date  . Hypertension   . Diabetes mellitus without complication (HCC)   . Asthma   . Coronary artery disease   . CHF (congestive heart failure) (HCC)   . Gout   . Neuropathy (HCC)     Medications:  Prescriptions prior to admission  Medication Sig Dispense Refill Last Dose  . aspirin EC 81 MG tablet Take 81 mg by mouth daily.   01/12/2015 at Unknown time  . atorvastatin (LIPITOR) 40 MG tablet Take 40 mg by mouth at bedtime.   01/11/2015 at Unknown time  . carvedilol (COREG) 25 MG tablet Take 25 mg by mouth 2 (two) times daily with a meal.   01/12/2015 at Unknown time  . cloNIDine (CATAPRES) 0.2 MG tablet Take 0.2 mg by mouth 3 (three) times daily.   01/12/2015 at Unknown time  . clopidogrel (PLAVIX) 75 MG tablet Take 75 mg by mouth daily.   01/12/2015 at Unknown time  . cyproheptadine (PERIACTIN) 4 MG tablet Take 4 mg by  mouth 3 (three) times daily as needed for allergies.   PRN  . FLUoxetine (PROZAC) 20 MG capsule Take 20 mg by mouth at bedtime.   01/11/2015 at Unknown time  . hydrALAZINE (APRESOLINE) 50 MG tablet Take 50 mg by mouth 3 (three) times daily.   01/12/2015 at Unknown time  . hydrochlorothiazide (HYDRODIURIL) 25 MG tablet Take 25 mg by mouth daily.   01/12/2015 at Unknown time  . isosorbide mononitrate (IMDUR) 60 MG 24 hr tablet Take 60 mg by mouth daily.   01/12/2015 at Unknown time  . losartan (COZAAR) 50 MG tablet Take 100 mg by mouth daily.   01/12/2015 at Unknown time  . pantoprazole (PROTONIX) 40 MG tablet Take 40 mg by mouth daily.   01/12/2015 at Unknown time  . ranolazine (RANEXA) 500 MG 12 hr tablet Take 500 mg by mouth 2 (two) times daily.   01/12/2015 at Unknown time    Assessment: Pharmacy consulted to dose heparin in this 59 year old male admitted with ACS.  No prior anticoag noted.  Goal of Therapy:  Heparin level 0.3-0.7 units/ml Monitor platelets by anticoagulation protocol: Yes   Plan:  Second heparin level therapeutic. Continue current rate. Will monitor daily.  Carola FrostNathan A Dajsha Massaro, Pharm.D., BCPS Clinical Pharmacist 01/13/2015,6:06 AM

## 2015-01-13 NOTE — Progress Notes (Signed)
Checked patient's BP prior to administering next dose of nitro paste. BP read 152/140. Notified MD and he stated that the reading couldn't be accurate. Changed the BP cuff to a larger size and rechecked BP which resulted as 123/109. Nursing staff will continue to monitor. Lamonte RicherKara A Rosalynd Mcwright, RN

## 2015-01-13 NOTE — Consult Note (Signed)
Cardiology Consultation Note  Patient ID: Darren LenisRobert XXXMosely, MRN: [id number], DOB/AGE: 02-09-1955 59 y.o. Admit date: 01/12/2015   Date of Consult: 01/13/2015 Primary Physician: No PCP Per Patient Primary Cardiologist: [facility]  Chief Complaint: Chest pain Reason for Consult: Unstable angina   HPI: 59 y.o. male with h/o CAD status post remote stenting x 8 all to the coronary arteries, most recently in June of 2016, reported ischemic cardiomyopathy, DM2 with associated peripheral neuropathy, HTN, obesity, and gout who presented from incarceration with chest pain not associated with exertion or rest.   We do not have prior cardiac records though patient reports having had his most recent PCI in June of 2016 in the setting of unstable angina. He reports 8 total cardiac stents with none in his legs or carotids. He has been on aspirin and Plavix as an outpatient. He is a prior smoker of 1ppd x 15 years, but quit in 2009. He denies any illegal drug abuse or alcohol abuse.   [facility] with nonexertional chest pain pain that radiated to his left arm that began on 12/20. Pain was not associated with SOB, diaphoresis, nausea, or vomiting. No presyncope, or syncope. Upon his arrival his BP was found to be elevated at 260/228, which has since improved. He is currently chest pain free.  Upon the [facility]'s [facility] they were found to have troponin x 3, . ECG as below, CXR showed no active disease.   Past Medical History  Diagnosis Date  . Hypertension   . Diabetes mellitus without complication (HCC)   . Asthma   . Coronary artery disease   . CHF (congestive heart failure) (HCC)   . Gout   . Neuropathy (HCC)       Most Recent Cardiac Studies: None available for review   Surgical History:  Past Surgical History  Procedure Laterality Date  . Cardiac surgery    . Percutaneous coronary stent intervention (pci-s)    . Tonsillectomy    . Appendectomy       Home  Meds: Prior to Admission medications   Medication Sig Start Date End Date Taking? Authorizing Provider  aspirin EC 81 MG tablet Take 81 mg by mouth daily.   Yes Historical Provider, MD  atorvastatin (LIPITOR) 40 MG tablet Take 40 mg by mouth at bedtime.   Yes Historical Provider, MD  carvedilol (COREG) 25 MG tablet Take 25 mg by mouth 2 (two) times daily with a meal.   Yes Historical Provider, MD  cloNIDine (CATAPRES) 0.2 MG tablet Take 0.2 mg by mouth 3 (three) times daily.   Yes Historical Provider, MD  clopidogrel (PLAVIX) 75 MG tablet Take 75 mg by mouth daily.   Yes Historical Provider, MD  cyproheptadine (PERIACTIN) 4 MG tablet Take 4 mg by mouth 3 (three) times daily as needed for allergies.   Yes Historical Provider, MD  FLUoxetine (PROZAC) 20 MG capsule Take 20 mg by mouth at bedtime.   Yes Historical Provider, MD  hydrALAZINE (APRESOLINE) 50 MG tablet Take 50 mg by mouth 3 (three) times daily.   Yes Historical Provider, MD  hydrochlorothiazide (HYDRODIURIL) 25 MG tablet Take 25 mg by mouth daily.   Yes Historical Provider, MD  isosorbide mononitrate (IMDUR) 60 MG 24 hr tablet Take 60 mg by mouth daily.   Yes Historical Provider, MD  losartan (COZAAR) 50 MG tablet Take 100 mg by mouth daily.   Yes Historical Provider, MD  pantoprazole (PROTONIX) 40 MG  tablet Take 40 mg by mouth daily.   Yes Historical Provider, MD  ranolazine (RANEXA) 500 MG 12 hr tablet Take 500 mg by mouth 2 (two) times daily.   Yes Historical Provider, MD    Inpatient Medications:  . aspirin EC  81 mg Oral Daily  . atorvastatin  40 mg Oral QHS  . carvedilol  25 mg Oral BID WC  . cloNIDine  0.2 mg Oral TID  . clopidogrel  75 mg Oral Daily  . FLUoxetine  20 mg Oral QHS  . hydrALAZINE  100 mg Oral 3 times per day  . hydrochlorothiazide  25 mg Oral Daily  . insulin aspart  0-20 Units Subcutaneous TID WC  . insulin aspart  0-5 Units Subcutaneous QHS  . isosorbide mononitrate  60 mg Oral Daily  . losartan  100 mg  Oral Daily  . nitroGLYCERIN  0.5 inch Topical 4 times per day  . pantoprazole  40 mg Oral QAC breakfast  . ranolazine  500 mg Oral BID  . sodium chloride  3 mL Intravenous Q12H   . heparin 1,400 Units/hr (01/12/15 1759)    Allergies:  Allergies  Allergen Reactions  . Ciprofloxacin   . Fish Allergy   . Keflex [Cephalexin]   . Motrin [Ibuprofen]   . Nifedipine   . Penicillins   . Pork-Derived Products   . Shrimp [Shellfish Allergy]   . Toradol [Ketorolac Tromethamine]   . Zofran [Ondansetron Hcl]     Social History   Social History  . Marital Status: Single    Spouse Name: N/A  . Number of Children: N/A  . Years of Education: N/A   Occupational History  . Not on file.   Social History Main Topics  . Smoking status: Former Games developer  . Smokeless tobacco: Not on file  . Alcohol Use: No  . Drug Use: No  . Sexual Activity: Not on file   Other Topics Concern  . Not on file   Social History Narrative     History reviewed. No pertinent family history.   Review of Systems: Review of Systems  Constitutional: Negative for fever, chills, weight loss, malaise/fatigue and diaphoresis.  HENT: Negative for congestion.   Eyes: Negative for discharge and redness.  Respiratory: Negative for cough, hemoptysis, sputum production, shortness of breath and wheezing.   Cardiovascular: Positive for chest pain. Negative for palpitations, orthopnea, claudication, leg swelling and PND.  Gastrointestinal: Negative for heartburn, nausea, vomiting and abdominal pain.  Musculoskeletal: Negative for falls.  Skin: Negative for rash.  Neurological: Negative for dizziness, tingling, tremors, sensory change, speech change, focal weakness, loss of consciousness and weakness.  Endo/Heme/Allergies: Does not bruise/bleed easily.  Psychiatric/Behavioral: Negative for substance abuse. The patient is not nervous/anxious.   All other systems reviewed and are negative.   Labs:  Recent Labs   01/12/15 1104 01/12/15 1843 01/12/15 2324 01/13/15 0505  TROPONINI <0.03 <0.03 <0.03 <0.03   Lab Results  Component Value Date   WBC 6.7 01/13/2015   HGB 11.8* 01/13/2015   HCT 36.5* 01/13/2015   MCV 89.8 01/13/2015   PLT 228 01/13/2015    Recent Labs Lab 01/13/15 0505  NA 140  K 3.7  CL 105  CO2 31  BUN 12  CREATININE 0.94  CALCIUM 8.8*  GLUCOSE 154*   Lab Results  Component Value Date   CHOL 116 01/13/2015   HDL 27* 01/13/2015   LDLCALC 67 01/13/2015   TRIG 111 01/13/2015   No results found for: DDIMER  Radiology/Studies:  Dg Chest Port 1 View  01/12/2015  CLINICAL DATA:  Chest pain EXAM: PORTABLE CHEST 1 VIEW COMPARISON:  08/19/2014 FINDINGS: Cardiac enlargement. Negative for heart failure. Lungs are clear without infiltrate or effusion. Negative for pneumonia. IMPRESSION: No active disease. Electronically Signed   By: Marlan Palau M.D.   On: 01/12/2015 11:45    EKG: stress test ECG: sinus bradycardia, 57 bpm TWI III, nonspecific st/t changes inferolateral leads   Weights: Filed Weights   01/12/15 1048 01/12/15 1710  Weight: 260 lb (117.935 kg) 283 lb 9.6 oz (128.64 kg)     Physical Exam: Blood pressure 143/108, pulse 86, temperature 98 F (36.7 C), temperature source Oral, resp. rate 20, height  (1.905 m), weight 283 lb 9.6 oz (128.64 kg), SpO2 97 %. Body mass index is 35.45 kg/(m^2). General: Well developed, well nourished, in no acute distress. Head: Normocephalic, atraumatic, sclera non-icteric, no xanthomas, nares are without discharge.  Neck: Negative for carotid bruits. JVD not elevated. Lungs: Clear bilaterally to auscultation without wheezes, rales, or rhonchi. Breathing is unlabored. Heart: RRR with S1 S2. No murmurs, rubs, or gallops appreciated. Abdomen: Obese, soft, non-tender, non-distended with normoactive bowel sounds. No hepatomegaly. No rebound/guarding. No obvious abdominal masses. Msk:  Strength and tone appear normal for  age. Extremities: No clubbing or cyanosis. No edema.  Distal pedal pulses are 2+ and equal bilaterally. Cuffed to the bed by officers.  Neuro: Alert and oriented X 3. No facial asymmetry. No focal deficit. Moves all extremities spontaneously. Psych:  Responds to questions appropriately with a normal affect.    Assessment and Plan:   1. Unstable angina/CAD: -Lexiscan Myoview ordered this morning by IM -Further recommendations following these results in the evening hours once study is completed -Continue aspirin and Plavix -Symptoms possibly exacerbated by accelerated HTN -Coreg 25 mg bid, titrate Imdur to 90 mg daily along with Ranexa to 1000 mg bid -Lipitor 40 mg daily  2. Accelerated HTN: -As above -Improved -Clonidine 0.2 mg tid, hydralazine 100 mg q 8 hours, HCTZ 25 mg daily, losartan 100 mg  3. Reported ischemic cardiomyopathy: -He does no appear volume overloaded -If indicated by nuclear study check echo, otherwise hold at this time -Coreg and losartan as above    Signed, Eula Listen, PA-C Pager: [phone] 01/13/2015, 10:39 AM

## 2015-01-13 NOTE — Discharge Summary (Signed)
[facility] [place] [facility]  DISCHARGE SUMMARY   PATIENT NAME: Darren LenisRobert XXXMosely    MR#:  [id number]  DATE OF BIRTH:  09-14-1955  DATE OF ADMISSION:  01/12/2015 ADMITTING PHYSICIAN: Adrian SaranSital Mody, MD  DATE OF DISCHARGE: 01/13/2015  PRIMARY CARE PHYSICIAN: No PCP Per Patient    ADMISSION DIAGNOSIS:  Hypertensive urgency [I16.0] Chest pain, unspecified chest pain type [R07.9]  DISCHARGE DIAGNOSIS:  Active Problems:   * No active hospital problems. *   SECONDARY DIAGNOSIS:   Past Medical History  Diagnosis Date  . Hypertension   . Diabetes mellitus without complication (HCC)   . Asthma   . Coronary artery disease   . CHF (congestive heart failure) (HCC)   . Gout   . Neuropathy (HCC)     HOSPITAL COURSE:   1. Chest pain is treated coronary artery disease: Has ruled out for ACS with negative cardiac enzymes 3. Nonspecific EKG changes. No events on telemetry. Has had stress test today which was reviewed by cardiology and considered low risk. Ejection fraction is normal. Pain likely due to strain in the setting of hypertension. Continue aspirin, Ranexa, statin, beta blocker and Plavix. Continue nitroglycerin as needed for chest pain. Heart healthy diet  2. Hypertension: Uncontrolled on presentation. Better control now with increased dose of hydralazine. Continue with Coreg, clonidine, hydralazine, hydrochlorothiazide, imdoor, Cozaar. Patient should maintain a low-sodium diet  3. Diabetes mellitus type 2:  A1c is 6.4. Good control. Not on hypoglycemic agents prior to admission. Continue carbohydrate modified diet.  DISCHARGE CONDITIONS:   Stable  CONSULTS OBTAINED:  Treatment Team:  Antonieta Ibaimothy J Gollan, MD  DRUG ALLERGIES:   Allergies  Allergen Reactions  . Ciprofloxacin   . Fish Allergy   . Keflex [Cephalexin]   . Motrin [Ibuprofen]   . Nifedipine   . Penicillins   . Pork-Derived Products   . Shrimp [Shellfish Allergy]   . Toradol  [Ketorolac Tromethamine]   . Zofran [Ondansetron Hcl]     DISCHARGE MEDICATIONS:   Current Discharge Medication List    CONTINUE these medications which have CHANGED   Details  hydrALAZINE (APRESOLINE) 50 MG tablet Take 2 tablets (100 mg total) by mouth 3 (three) times daily. Qty: 90 tablet, Refills: 0      CONTINUE these medications which have NOT CHANGED   Details  aspirin EC 81 MG tablet Take 81 mg by mouth daily.    atorvastatin (LIPITOR) 40 MG tablet Take 40 mg by mouth at bedtime.    carvedilol (COREG) 25 MG tablet Take 25 mg by mouth 2 (two) times daily with a meal.    cloNIDine (CATAPRES) 0.2 MG tablet Take 0.2 mg by mouth 3 (three) times daily.    clopidogrel (PLAVIX) 75 MG tablet Take 75 mg by mouth daily.    cyproheptadine (PERIACTIN) 4 MG tablet Take 4 mg by mouth 3 (three) times daily as needed for allergies.    FLUoxetine (PROZAC) 20 MG capsule Take 20 mg by mouth at bedtime.    hydrochlorothiazide (HYDRODIURIL) 25 MG tablet Take 25 mg by mouth daily.    isosorbide mononitrate (IMDUR) 60 MG 24 hr tablet Take 60 mg by mouth daily.    losartan (COZAAR) 50 MG tablet Take 100 mg by mouth daily.    pantoprazole (PROTONIX) 40 MG tablet Take 40 mg by mouth daily.    ranolazine (RANEXA) 500 MG 12 hr tablet Take 500 mg by mouth 2 (two) times daily.  DISCHARGE INSTRUCTIONS:    Stable condition. Heart healthy, low-sodium, carbohydrate modified diet. No home health needs. No supplemental oxygen.  If you experience worsening of your admission symptoms, develop shortness of breath, life threatening emergency, suicidal or homicidal thoughts you must seek medical attention immediately by calling 911 or calling your MD immediately  if symptoms less severe.  You Must read complete instructions/literature along with all the possible adverse reactions/side effects for all the Medicines you take and that have been prescribed to you. Take any new Medicines after you  have completely understood and accept all the possible adverse reactions/side effects.   Please note  You were cared for by a hospitalist during your hospital stay. If you have any questions about your discharge medications or the care you received while you were in the hospital after you are discharged, you can call the unit and asked to speak with the hospitalist on call if the hospitalist that took care of you is not available. Once you are discharged, your primary care physician will handle any further medical issues. Please note that NO REFILLS for any discharge medications will be authorized once you are discharged, as it is imperative that you return to your primary care physician (or establish a relationship with a primary care physician if you do not have one) for your aftercare needs so that they can reassess your need for medications and monitor your lab values.    Today   CHIEF COMPLAINT:   Chief Complaint  Patient presents with  . Chest Pain    HISTORY OF PRESENT ILLNESS:  Darren Morales is a 59 y.o. male with a known history of coronary artery disease, essential hypertension and diabetes who presents from prison due to above complaint. Patient reports and sign 30s mornings at substernal chest pain rating to his left side. He denies shortness of breath or nausea associated. He says it's chest pain lasted a few hours. This is relieved with nitroglycerin that was given in the ER. It was also noted in the emergency room that his blood pressure was very elevated. He is chest pain-free currently. Patient denies any exacerbating factors. He states he had stents placed in June 2016.  VITAL SIGNS:  Blood pressure 129/68, pulse 63, temperature 97.7 F (36.5 C), temperature source Oral, resp. rate 20, height  (1.905 m), weight 128.64 kg (283 lb 9.6 oz), SpO2 97 %.  I/O:   Intake/Output Summary (Last 24 hours) at 01/13/15 1605 Last data filed at 01/13/15 1500  Gross per 24 hour   Intake  654.3 ml  Output    850 ml  Net -195.7 ml    PHYSICAL EXAMINATION:  GENERAL:  59 y.o.-year-old patient lying in the bed with no acute distress.  LUNGS: Normal breath sounds bilaterally, no wheezing, rales,rhonchi or crepitation. No use of accessory muscles of respiration.  CARDIOVASCULAR: S1, S2 normal. No murmurs, rubs, or gallops.  ABDOMEN: Soft, non-tender, non-distended. Bowel sounds present. No organomegaly or mass.  EXTREMITIES: No pedal edema, cyanosis, or clubbing. Pulses 2+ NEUROLOGIC: Cranial nerves II through XII are intact. Muscle strength 5/5 in all extremities. Sensation intact. Gait not checked.  PSYCHIATRIC: The patient is alert and oriented x 3. Calm. No distress. SKIN: No obvious rash, lesion, or ulcer.   DATA REVIEW:   CBC  Recent Labs Lab 01/13/15 0505  WBC 6.7  HGB 11.8*  HCT 36.5*  PLT 228    Chemistries   Recent Labs Lab 01/13/15 0505  NA 140  K  3.7  CL 105  CO2 31  GLUCOSE 154*  BUN 12  CREATININE 0.94  CALCIUM 8.8*    Cardiac Enzymes  Recent Labs Lab 01/13/15 0505  TROPONINI <0.03    Microbiology Results  No results found for this or any previous visit.  RADIOLOGY:  Nm Myocar Multi W/spect W/wall Motion / Ef  01/13/2015   T wave inversion was noted during stress in the V5, V6 and III leads.  There was no ST segment deviation noted during stress.  The study is normal.  This is a low risk study.  The left ventricular ejection fraction is normal (55-65%).    Dg Chest Port 1 View  01/12/2015  CLINICAL DATA:  Chest pain EXAM: PORTABLE CHEST 1 VIEW COMPARISON:  08/19/2014 FINDINGS: Cardiac enlargement. Negative for heart failure. Lungs are clear without infiltrate or effusion. Negative for pneumonia. IMPRESSION: No active disease. Electronically Signed   By: Marlan Palau M.D.   On: 01/12/2015 11:45    EKG:   Orders placed or performed during the hospital encounter of 01/12/15  . ED EKG within 10 minutes  . ED EKG  within 10 minutes      Management plans discussed with the patient, family and they are in agreement.  CODE STATUS:     Code Status Orders        Start     Ordered   01/12/15 1718  Full code   Continuous     01/12/15 1717      TOTAL TIME TAKING CARE OF THIS PATIENT: 35 minutes.  Greater than 50% of time spent in care coordination and counseling.  Elby Showers M.D on 01/13/2015 at 4:05 PM  Between 7am to 6pm - Pager - [phone]  After 6pm go to [email or web address] - password [email or web address] [facility]  [facility]  Office  [phone]  CC: Primary care physician; No PCP Per Patient

## 2015-01-13 NOTE — Discharge Instructions (Signed)
°  DIET:  Cardiac diet and Low fat, Low cholesterol diet, carbohydrate modified  DISCHARGE CONDITION:  Stable  ACTIVITY:  Activity as tolerated  OXYGEN:  Home Oxygen: No.   Oxygen Delivery: room air  DISCHARGE LOCATION:  jail   If you experience worsening of your admission symptoms, develop shortness of breath, life threatening emergency, suicidal or homicidal thoughts you must seek medical attention immediately by calling 911 or calling your MD immediately  if symptoms less severe.  You Must read complete instructions/literature along with all the possible adverse reactions/side effects for all the Medicines you take and that have been prescribed to you. Take any new Medicines after you have completely understood and accpet all the possible adverse reactions/side effects.   Please note  You were cared for by a hospitalist during your hospital stay. If you have any questions about your discharge medications or the care you received while you were in the hospital after you are discharged, you can call the unit and asked to speak with the hospitalist on call if the hospitalist that took care of you is not available. Once you are discharged, your primary care physician will handle any further medical issues. Please note that NO REFILLS for any discharge medications will be authorized once you are discharged, as it is imperative that you return to your primary care physician (or establish a relationship with a primary care physician if you do not have one) for your aftercare needs so that they can reassess your need for medications and monitor your lab values.

## 2015-01-13 NOTE — Progress Notes (Signed)
Patient has rested quietly tonight. Minimal complaints of pain acknowledged and treated with PRN medication; no signs of discomfort or distress noted. Patient complained of soreness around ankle where cuff is placed. No redness or swelling noted and lotion provided relief. Patient has remained NPO for stress test scheduled for today. Nursing staff will continue to monitor. Lamonte RicherKara A Raylei Losurdo, RN

## 2015-01-13 NOTE — Progress Notes (Signed)
ANTICOAGULATION CONSULT NOTE - Initial Consult  Pharmacy Consult for Heparin  Indication: chest pain/ACS  Allergies  Allergen Reactions  . Ciprofloxacin   . Fish Allergy   . Keflex [Cephalexin]   . Motrin [Ibuprofen]   . Nifedipine   . Penicillins   . Pork-Derived Products   . Shrimp [Shellfish Allergy]   . Toradol [Ketorolac Tromethamine]   . Zofran [Ondansetron Hcl]     Patient Measurements: Height:  (190.5 cm) Weight: 283 lb 9.6 oz (128.64 kg) IBW/kg (Calculated) : 84.5 Heparin Dosing Weight: 109.3 kg   Vital Signs: Temp: 97.7 F (36.5 C) (12/20 2038) Temp Source: Oral (12/20 2038) BP: 123/109 mmHg (12/21 0025) Pulse Rate: 65 (12/21 0025)  Labs:  Recent Labs  01/12/15 1104 01/12/15 1843 01/12/15 2324  HGB 12.2*  --   --   HCT 38.8*  --   --   PLT 252  --   --   HEPARINUNFRC  --   --  0.41  CREATININE 0.85  --   --   TROPONINI <0.03 <0.03 <0.03    Estimated Creatinine Clearance: 138.5 mL/min (by C-G formula based on Cr of 0.85).   Medical History: Past Medical History  Diagnosis Date  . Hypertension   . Diabetes mellitus without complication (HCC)   . Asthma   . Coronary artery disease   . CHF (congestive heart failure) (HCC)   . Gout   . Neuropathy (HCC)     Medications:  Prescriptions prior to admission  Medication Sig Dispense Refill Last Dose  . aspirin EC 81 MG tablet Take 81 mg by mouth daily.   01/12/2015 at Unknown time  . atorvastatin (LIPITOR) 40 MG tablet Take 40 mg by mouth at bedtime.   01/11/2015 at Unknown time  . carvedilol (COREG) 25 MG tablet Take 25 mg by mouth 2 (two) times daily with a meal.   01/12/2015 at Unknown time  . cloNIDine (CATAPRES) 0.2 MG tablet Take 0.2 mg by mouth 3 (three) times daily.   01/12/2015 at Unknown time  . clopidogrel (PLAVIX) 75 MG tablet Take 75 mg by mouth daily.   01/12/2015 at Unknown time  . cyproheptadine (PERIACTIN) 4 MG tablet Take 4 mg by mouth 3 (three) times daily as needed for  allergies.   PRN  . FLUoxetine (PROZAC) 20 MG capsule Take 20 mg by mouth at bedtime.   01/11/2015 at Unknown time  . hydrALAZINE (APRESOLINE) 50 MG tablet Take 50 mg by mouth 3 (three) times daily.   01/12/2015 at Unknown time  . hydrochlorothiazide (HYDRODIURIL) 25 MG tablet Take 25 mg by mouth daily.   01/12/2015 at Unknown time  . isosorbide mononitrate (IMDUR) 60 MG 24 hr tablet Take 60 mg by mouth daily.   01/12/2015 at Unknown time  . losartan (COZAAR) 50 MG tablet Take 100 mg by mouth daily.   01/12/2015 at Unknown time  . pantoprazole (PROTONIX) 40 MG tablet Take 40 mg by mouth daily.   01/12/2015 at Unknown time  . ranolazine (RANEXA) 500 MG 12 hr tablet Take 500 mg by mouth 2 (two) times daily.   01/12/2015 at Unknown time    Assessment: Pharmacy consulted to dose heparin in this 59 year old male admitted with ACS.  No prior anticoag noted.  Goal of Therapy:  Heparin level 0.3-0.7 units/ml Monitor platelets by anticoagulation protocol: Yes   Plan:  First heparin level therapeutic. Continue current rate. Will order confirmatory level in 6 hours.  Carola Frost, Pharm.D.,  BCPS Clinical Pharmacist 01/13/2015,12:40 AM

## 2015-01-13 NOTE — Progress Notes (Signed)
Pt. [facility] via [facility] with two corrections officer accompanying. Discharge instructions and medication regimen reviewed at bedside with patient. Pt. verbalizes understanding of instructions and medication regimen. Prescriptions given to officers. Patient assessment unchanged from this morning. TELE and IV discontinued per policy.

## 2017-02-02 IMAGING — DX DG CHEST 2V
2 series · 2 of 2 positions shown · non-contrast
Comparison: None.

CLINICAL DATA: Chest pain for 1 day

EXAM:
CHEST  2 VIEW

[chest lat]
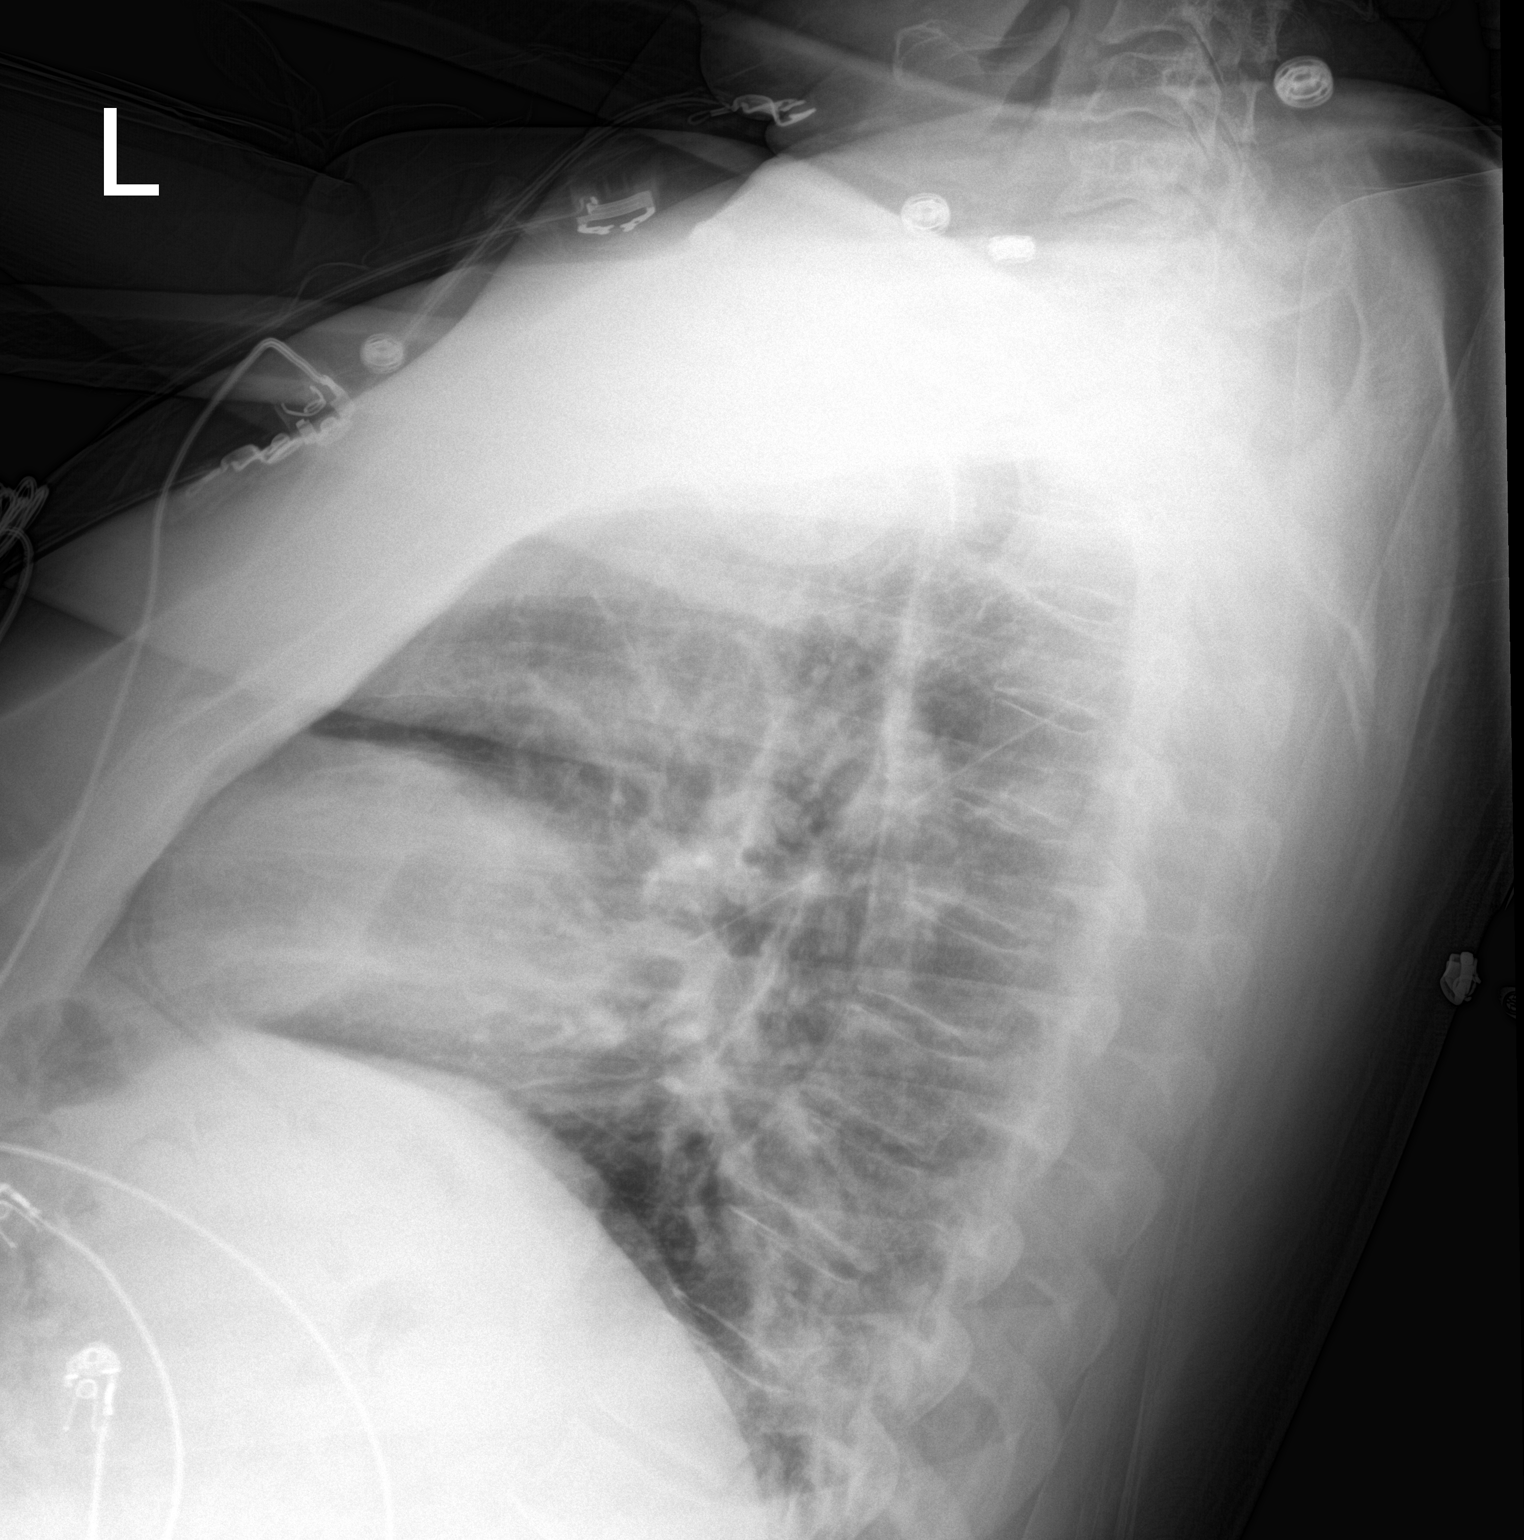

[chest ap]
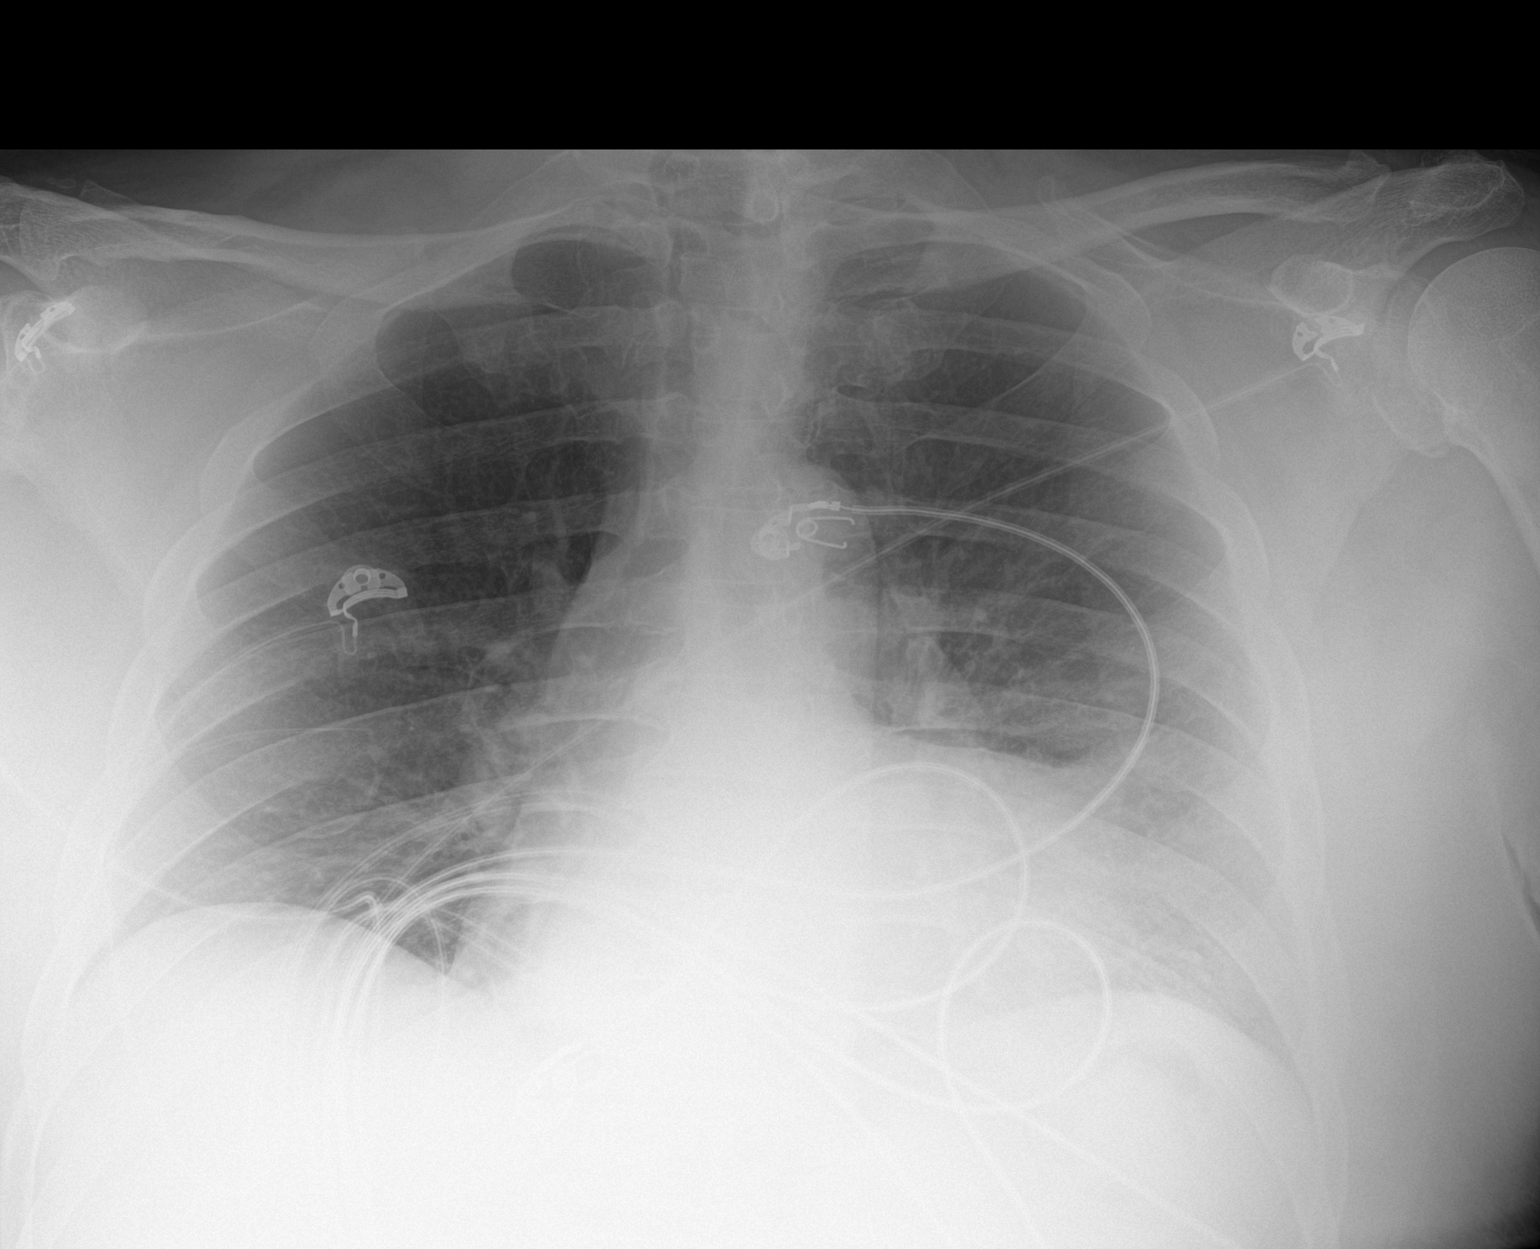

[2 of 2 positions shown; findings below may reference images not displayed]

FINDINGS: There is infiltrate in the right middle lobe. Lungs elsewhere clear.
Heart size is within normal limits. Pulmonary vascularity is normal.
No adenopathy. No bone lesions.
IMPRESSION: Consolidation right middle lobe. Followup PA and lateral chest
radiographs recommended in 3-4 weeks following trial of antibiotic
therapy to ensure resolution and exclude underlying malignancy.

## 2020-11-23 DEATH — deceased
